# Patient Record
Sex: Male | Born: 1980 | Race: White | Hispanic: No | Marital: Single | Smoking: Never smoker
Health system: Southern US, Community
[De-identification: ages and names within clinical notes are randomized; demographics above are authoritative.]

## PROBLEM LIST (undated history)

## (undated) ENCOUNTER — Emergency Department (HOSPITAL_COMMUNITY): Admission: EM | Payer: BC Managed Care – PPO | Source: Home / Self Care

## (undated) DIAGNOSIS — R51 Headache: Secondary | ICD-10-CM

## (undated) DIAGNOSIS — K219 Gastro-esophageal reflux disease without esophagitis: Secondary | ICD-10-CM

## (undated) HISTORY — DX: Gastro-esophageal reflux disease without esophagitis: K21.9

## (undated) HISTORY — PX: HERNIA REPAIR: SHX51

---

## 2013-03-01 ENCOUNTER — Other Ambulatory Visit: Payer: Self-pay | Admitting: Gastroenterology

## 2013-03-01 DIAGNOSIS — R1013 Epigastric pain: Secondary | ICD-10-CM

## 2013-03-08 ENCOUNTER — Ambulatory Visit
Admission: RE | Admit: 2013-03-08 | Discharge: 2013-03-08 | Disposition: A | Discharge status: Home / Self Care | Payer: BC Managed Care – PPO | Source: Ambulatory Visit | Attending: Gastroenterology | Admitting: Gastroenterology

## 2013-03-08 DIAGNOSIS — R1013 Epigastric pain: Secondary | ICD-10-CM

## 2013-03-11 ENCOUNTER — Other Ambulatory Visit: Payer: Self-pay | Admitting: Gastroenterology

## 2013-03-11 DIAGNOSIS — R1013 Epigastric pain: Secondary | ICD-10-CM

## 2013-03-14 ENCOUNTER — Other Ambulatory Visit: Payer: Self-pay | Admitting: Gastroenterology

## 2013-03-14 DIAGNOSIS — R1013 Epigastric pain: Secondary | ICD-10-CM

## 2013-03-14 DIAGNOSIS — N200 Calculus of kidney: Secondary | ICD-10-CM

## 2013-03-19 ENCOUNTER — Other Ambulatory Visit: Payer: Self-pay | Admitting: Gastroenterology

## 2013-03-19 DIAGNOSIS — R1013 Epigastric pain: Secondary | ICD-10-CM

## 2013-03-19 DIAGNOSIS — R935 Abnormal findings on diagnostic imaging of other abdominal regions, including retroperitoneum: Secondary | ICD-10-CM

## 2013-03-20 ENCOUNTER — Ambulatory Visit
Admission: RE | Admit: 2013-03-20 | Discharge: 2013-03-20 | Disposition: A | Discharge status: Home / Self Care | Payer: BC Managed Care – PPO | Source: Ambulatory Visit | Attending: Gastroenterology | Admitting: Gastroenterology

## 2013-03-20 DIAGNOSIS — R935 Abnormal findings on diagnostic imaging of other abdominal regions, including retroperitoneum: Secondary | ICD-10-CM

## 2013-03-20 DIAGNOSIS — R1013 Epigastric pain: Secondary | ICD-10-CM

## 2013-03-20 MED ORDER — IOHEXOL 300 MG/ML  SOLN
100.0000 mL | Freq: Once | INTRAMUSCULAR | Status: AC | PRN
  Administered 2013-03-20: 100 mL via INTRAVENOUS

## 2013-03-22 ENCOUNTER — Other Ambulatory Visit: Payer: Self-pay | Admitting: Gastroenterology

## 2013-03-22 DIAGNOSIS — R1013 Epigastric pain: Secondary | ICD-10-CM

## 2013-03-29 ENCOUNTER — Ambulatory Visit (HOSPITAL_COMMUNITY)
Admission: RE | Admit: 2013-03-29 | Discharge: 2013-03-29 | Disposition: A | Discharge status: Home / Self Care | Payer: BC Managed Care – PPO | Source: Ambulatory Visit | Attending: Gastroenterology | Admitting: Gastroenterology

## 2013-03-29 DIAGNOSIS — R948 Abnormal results of function studies of other organs and systems: Secondary | ICD-10-CM | POA: Insufficient documentation

## 2013-03-29 DIAGNOSIS — R1013 Epigastric pain: Secondary | ICD-10-CM | POA: Insufficient documentation

## 2013-03-29 MED ORDER — SINCALIDE 5 MCG IJ SOLR
INTRAMUSCULAR | Status: AC
Start: 2013-03-29 — End: 2013-03-29
  Administered 2013-03-29: 1.95 ug via INTRAVENOUS
  Filled 2013-03-29: qty 5

## 2013-03-29 MED ORDER — SINCALIDE 5 MCG IJ SOLR
0.0200 ug/kg | Freq: Once | INTRAMUSCULAR | Status: AC
  Administered 2013-03-29: 1.95 ug via INTRAVENOUS

## 2013-04-01 ENCOUNTER — Ambulatory Visit (INDEPENDENT_AMBULATORY_CARE_PROVIDER_SITE_OTHER): Payer: BC Managed Care – PPO | Admitting: General Surgery

## 2013-04-01 ENCOUNTER — Encounter (INDEPENDENT_AMBULATORY_CARE_PROVIDER_SITE_OTHER): Payer: Self-pay | Admitting: General Surgery

## 2013-04-01 VITALS — BP 120/80 | HR 72 | Temp 98.5°F | Resp 14 | Ht 72.0 in | Wt 212.4 lb

## 2013-04-01 DIAGNOSIS — K828 Other specified diseases of gallbladder: Secondary | ICD-10-CM

## 2013-04-01 NOTE — Progress Notes (Signed)
Patient ID: Grant [name], male   DOB: 06/12/1980, 32 y.o.   MRN: [id number]  Chief Complaint  Patient presents with  . New Evaluation    eval GB    HPI Grant [name] is a 32 y.o. male.  The patient comes in for evaluation for biliary dyskinesia.  HPI The patient has had symptoms for these the past 5 months. The pain that he has had this pain in the epigastrium and right upper quadrant. It is not always postprandial, however he does occur sometimes after eating large amounts of fatty foods. A workup has ensued with him seeing a gastroenterologist locally. The only positive study was the HIDA scan. This demonstrated an ejection fraction of only 12%. According to the patient, he was also symptomatic with nausea and pain when he was injected for the study.  Past Medical History  Diagnosis Date  . GERD (gastroesophageal reflux disease)     Past Surgical History  Procedure Laterality Date  . Hernia repair      History reviewed. No pertinent family history.  Social History History  Substance Use Topics  . Smoking status: Never Smoker   . Smokeless tobacco: Never Used  . Alcohol Use: 1.0 oz/week    2 drink(s) per week     Comment: weekly    Allergies  Allergen Reactions  . Oxycodone   . Penicillins     No current outpatient prescriptions on file.   No current facility-administered medications for this visit.    Review of Systems Review of Systems  Constitutional: Positive for appetite change. Negative for fever and chills.  Gastrointestinal: Positive for abdominal pain.  All other systems reviewed and are negative.    Blood pressure 120/80, pulse 72, temperature 98.5 F (36.9 C), temperature source Oral, resp. rate 14, height 6' (1.829 m), weight 212 lb 6.6 oz (96.349 kg).  Physical Exam Physical Exam  Vitals reviewed. Constitutional: He is oriented to person, place, and time. He appears well-developed and well-nourished.  HENT:  Head:  Normocephalic and atraumatic.  Eyes: Conjunctivae and EOM are normal. Pupils are equal, round, and reactive to light.  Neck: Normal range of motion. Neck supple.  Cardiovascular: Normal rate, regular rhythm and normal heart sounds.   Pulmonary/Chest: Effort normal and breath sounds normal.  Abdominal: Soft. Bowel sounds are normal. There is tenderness (epigastrium and RUQ).  Musculoskeletal: Normal range of motion.  Neurological: He is alert and oriented to person, place, and time. He has normal reflexes.  Skin: Skin is warm and dry.  Psychiatric: He has a normal mood and affect. His behavior is normal. Thought content normal.    Data Reviewed I have reviewed all the studies done to workup his abdominal pain. I've also reviewed the notes from the referring physician.  Assessment    Symptomatic biliary dyskinesia.     Plan    Laparoscopic cholecystectomy with possible cholangiogram.  The risk and benefits have been explained to the patient and his spouse. They wish to proceed as soon as possible.     The patient also has a left lower lobe nodule seen on his CT scan done for his abdominal discomfort. This may warrant further evaluation in the future.   Magdeline Prange O 04/01/2013, 1:04 PM    

## 2013-04-09 ENCOUNTER — Encounter (HOSPITAL_COMMUNITY): Payer: Self-pay | Admitting: Pharmacist

## 2013-04-10 ENCOUNTER — Encounter (HOSPITAL_COMMUNITY)
Admission: RE | Admit: 2013-04-10 | Discharge: 2013-04-10 | Disposition: A | Discharge status: Home / Self Care | Payer: BC Managed Care – PPO | Source: Ambulatory Visit | Attending: General Surgery | Admitting: General Surgery

## 2013-04-10 ENCOUNTER — Encounter (HOSPITAL_COMMUNITY): Payer: Self-pay

## 2013-04-10 DIAGNOSIS — Z01812 Encounter for preprocedural laboratory examination: Secondary | ICD-10-CM | POA: Insufficient documentation

## 2013-04-10 HISTORY — DX: Headache: R51

## 2013-04-10 LAB — COMPREHENSIVE METABOLIC PANEL
ALBUMIN: 4.1 g/dL (ref 3.5–5.2)
ALT: 15 U/L (ref 0–53)
AST: 23 U/L (ref 0–37)
Alkaline Phosphatase: 56 U/L (ref 39–117)
BUN: 11 mg/dL (ref 6–23)
CHLORIDE: 101 meq/L (ref 96–112)
CO2: 30 meq/L (ref 19–32)
Calcium: 9.6 mg/dL (ref 8.4–10.5)
Creatinine, Ser: 1.05 mg/dL (ref 0.50–1.35)
GFR calc Af Amer: >90 mL/min (ref >90)
Glucose, Bld: 82 mg/dL (ref 70–99)
Potassium: 4.3 mEq/L (ref 3.7–5.3)
SODIUM: 141 meq/L (ref 137–147)
Total Bilirubin: 0.5 mg/dL (ref 0.3–1.2)
Total Protein: 7.5 g/dL (ref 6.0–8.3)

## 2013-04-10 LAB — CBC WITH DIFFERENTIAL/PLATELET
BASOS PCT: 1 % (ref 0–1)
Basophils Absolute: 0.1 10*3/uL (ref 0.0–0.1)
Eosinophils Absolute: 0.3 10*3/uL (ref 0.0–0.7)
Eosinophils Relative: 6 % — ABNORMAL HIGH (ref 0–5)
HCT: 46 % (ref 39.0–52.0)
Hemoglobin: 16.2 g/dL (ref 13.0–17.0)
LYMPHS PCT: 37 % (ref 12–46)
Lymphs Abs: 1.9 10*3/uL (ref 0.7–4.0)
MCH: 30.4 pg (ref 26.0–34.0)
MCHC: 35.2 g/dL (ref 30.0–36.0)
MCV: 86.3 fL (ref 78.0–100.0)
Monocytes Absolute: 0.3 10*3/uL (ref 0.1–1.0)
Monocytes Relative: 7 % (ref 3–12)
NEUTROS ABS: 2.5 10*3/uL (ref 1.7–7.7)
Neutrophils Relative %: 49 % (ref 43–77)
PLATELETS: 261 10*3/uL (ref 150–400)
RBC: 5.33 MIL/uL (ref 4.22–5.81)
RDW: 12.1 % (ref 11.5–15.5)
WBC: 5.1 10*3/uL (ref 4.0–10.5)

## 2013-04-14 MED ORDER — CIPROFLOXACIN IN D5W 400 MG/200ML IV SOLN
400.0000 mg | INTRAVENOUS | Status: AC
  Administered 2013-04-15: 400 mg via INTRAVENOUS
  Filled 2013-04-14: qty 200

## 2013-04-15 ENCOUNTER — Encounter (HOSPITAL_COMMUNITY): Payer: BC Managed Care – PPO | Admitting: Anesthesiology

## 2013-04-15 ENCOUNTER — Ambulatory Visit (HOSPITAL_COMMUNITY): Payer: BC Managed Care – PPO | Admitting: Anesthesiology

## 2013-04-15 ENCOUNTER — Encounter (HOSPITAL_COMMUNITY)
Admission: RE | Disposition: A | Discharge status: Home / Self Care | Payer: Self-pay | Source: Ambulatory Visit | Attending: General Surgery

## 2013-04-15 ENCOUNTER — Ambulatory Visit (HOSPITAL_COMMUNITY)
Admission: RE | Admit: 2013-04-15 | Discharge: 2013-04-15 | Disposition: A | Discharge status: Home / Self Care | Payer: BC Managed Care – PPO | Source: Ambulatory Visit | Attending: General Surgery | Admitting: General Surgery

## 2013-04-15 ENCOUNTER — Ambulatory Visit (HOSPITAL_COMMUNITY): Payer: BC Managed Care – PPO

## 2013-04-15 ENCOUNTER — Encounter (HOSPITAL_COMMUNITY): Payer: Self-pay | Admitting: Anesthesiology

## 2013-04-15 DIAGNOSIS — K828 Other specified diseases of gallbladder: Secondary | ICD-10-CM

## 2013-04-15 DIAGNOSIS — K811 Chronic cholecystitis: Secondary | ICD-10-CM

## 2013-04-15 DIAGNOSIS — R51 Headache: Secondary | ICD-10-CM | POA: Insufficient documentation

## 2013-04-15 DIAGNOSIS — K219 Gastro-esophageal reflux disease without esophagitis: Secondary | ICD-10-CM | POA: Insufficient documentation

## 2013-04-15 HISTORY — PX: CHOLECYSTECTOMY: SHX55

## 2013-04-15 SURGERY — LAPAROSCOPIC CHOLECYSTECTOMY WITH INTRAOPERATIVE CHOLANGIOGRAM
Anesthesia: General | Site: Abdomen

## 2013-04-15 MED ORDER — ONDANSETRON HCL 4 MG/2ML IJ SOLN
INTRAMUSCULAR | Status: DC | PRN
  Administered 2013-04-15: 4 mg via INTRAVENOUS

## 2013-04-15 MED ORDER — ONDANSETRON HCL 4 MG/2ML IJ SOLN
INTRAMUSCULAR | Status: AC
  Filled 2013-04-15: qty 2

## 2013-04-15 MED ORDER — FENTANYL CITRATE 0.05 MG/ML IJ SOLN
25.0000 ug | INTRAMUSCULAR | Status: DC | PRN
  Administered 2013-04-15: 50 ug via INTRAVENOUS

## 2013-04-15 MED ORDER — PHENYLEPHRINE 40 MCG/ML (10ML) SYRINGE FOR IV PUSH (FOR BLOOD PRESSURE SUPPORT)
PREFILLED_SYRINGE | INTRAVENOUS | Status: AC
  Filled 2013-04-15: qty 10

## 2013-04-15 MED ORDER — GLYCOPYRROLATE 0.2 MG/ML IJ SOLN
INTRAMUSCULAR | Status: AC
  Filled 2013-04-15: qty 3

## 2013-04-15 MED ORDER — ARTIFICIAL TEARS OP OINT
TOPICAL_OINTMENT | OPHTHALMIC | Status: AC
  Filled 2013-04-15: qty 3.5

## 2013-04-15 MED ORDER — PROPOFOL 10 MG/ML IV BOLUS
INTRAVENOUS | Status: DC | PRN
  Administered 2013-04-15: 200 mg via INTRAVENOUS

## 2013-04-15 MED ORDER — NEOSTIGMINE METHYLSULFATE 1 MG/ML IJ SOLN
INTRAMUSCULAR | Status: AC
  Filled 2013-04-15: qty 10

## 2013-04-15 MED ORDER — FENTANYL CITRATE 0.05 MG/ML IJ SOLN
INTRAMUSCULAR | Status: DC | PRN
  Administered 2013-04-15: 50 ug via INTRAVENOUS
  Administered 2013-04-15: 150 ug via INTRAVENOUS
  Administered 2013-04-15: 50 ug via INTRAVENOUS

## 2013-04-15 MED ORDER — TRAMADOL HCL 50 MG PO TABS: 50.0000 mg | ORAL_TABLET | Freq: Four times a day (QID) | ORAL | Status: DC | PRN

## 2013-04-15 MED ORDER — ROCURONIUM BROMIDE 100 MG/10ML IV SOLN
INTRAVENOUS | Status: DC | PRN
  Administered 2013-04-15: 45 mg via INTRAVENOUS

## 2013-04-15 MED ORDER — ROCURONIUM BROMIDE 50 MG/5ML IV SOLN
INTRAVENOUS | Status: AC
  Filled 2013-04-15: qty 1

## 2013-04-15 MED ORDER — KETOROLAC TROMETHAMINE 30 MG/ML IJ SOLN
INTRAMUSCULAR | Status: AC
  Filled 2013-04-15: qty 1

## 2013-04-15 MED ORDER — MIDAZOLAM HCL 2 MG/2ML IJ SOLN
INTRAMUSCULAR | Status: AC
  Filled 2013-04-15: qty 2

## 2013-04-15 MED ORDER — GLYCOPYRROLATE 0.2 MG/ML IJ SOLN
INTRAMUSCULAR | Status: AC
Start: 2013-04-15 — End: 2013-04-15
  Filled 2013-04-15: qty 4

## 2013-04-15 MED ORDER — LACTATED RINGERS IV SOLN
INTRAVENOUS | Status: DC | PRN
  Administered 2013-04-15 (×2): via INTRAVENOUS

## 2013-04-15 MED ORDER — PROPOFOL 10 MG/ML IV BOLUS
INTRAVENOUS | Status: AC
  Filled 2013-04-15: qty 20

## 2013-04-15 MED ORDER — 0.9 % SODIUM CHLORIDE (POUR BTL) OPTIME
TOPICAL | Status: DC | PRN
  Administered 2013-04-15: 1000 mL

## 2013-04-15 MED ORDER — ACETAMINOPHEN 325 MG PO TABS: 325.0000 mg | ORAL_TABLET | ORAL | Status: DC | PRN

## 2013-04-15 MED ORDER — ONDANSETRON HCL 4 MG/2ML IJ SOLN: 4.0000 mg | Freq: Once | INTRAMUSCULAR | Status: DC | PRN

## 2013-04-15 MED ORDER — KETOROLAC TROMETHAMINE 30 MG/ML IJ SOLN
15.0000 mg | Freq: Once | INTRAMUSCULAR | Status: AC | PRN
  Administered 2013-04-15: 15 mg via INTRAVENOUS

## 2013-04-15 MED ORDER — ARTIFICIAL TEARS OP OINT
TOPICAL_OINTMENT | OPHTHALMIC | Status: DC | PRN
  Administered 2013-04-15: 1 via OPHTHALMIC

## 2013-04-15 MED ORDER — GLYCOPYRROLATE 0.2 MG/ML IJ SOLN
INTRAMUSCULAR | Status: DC | PRN
  Administered 2013-04-15: 1 mg via INTRAVENOUS

## 2013-04-15 MED ORDER — ACETAMINOPHEN 160 MG/5ML PO SOLN
325.0000 mg | ORAL | Status: DC | PRN
  Filled 2013-04-15: qty 20.3

## 2013-04-15 MED ORDER — BUPIVACAINE-EPINEPHRINE 0.25% -1:200000 IJ SOLN
INTRAMUSCULAR | Status: DC | PRN
  Administered 2013-04-15: 30 mL

## 2013-04-15 MED ORDER — NEOSTIGMINE METHYLSULFATE 1 MG/ML IJ SOLN
INTRAMUSCULAR | Status: DC | PRN
  Administered 2013-04-15: 5 mg via INTRAVENOUS

## 2013-04-15 MED ORDER — LACTATED RINGERS IV SOLN
INTRAVENOUS | Status: DC
  Administered 2013-04-15: 08:00:00 via INTRAVENOUS

## 2013-04-15 MED ORDER — FENTANYL CITRATE 0.05 MG/ML IJ SOLN
INTRAMUSCULAR | Status: AC
  Filled 2013-04-15: qty 2

## 2013-04-15 MED ORDER — SODIUM CHLORIDE 0.9 % IR SOLN
Status: DC | PRN
  Administered 2013-04-15: 1000 mL

## 2013-04-15 MED ORDER — FENTANYL CITRATE 0.05 MG/ML IJ SOLN
INTRAMUSCULAR | Status: AC
  Filled 2013-04-15: qty 5

## 2013-04-15 MED ORDER — LIDOCAINE HCL (CARDIAC) 20 MG/ML IV SOLN
INTRAVENOUS | Status: DC | PRN
  Administered 2013-04-15: 80 mg via INTRAVENOUS

## 2013-04-15 MED ORDER — SODIUM CHLORIDE 0.9 % IV SOLN
INTRAVENOUS | Status: DC | PRN
  Administered 2013-04-15: 10:00:00

## 2013-04-15 MED ORDER — BUPIVACAINE-EPINEPHRINE PF 0.25-1:200000 % IJ SOLN
INTRAMUSCULAR | Status: AC
  Filled 2013-04-15: qty 30

## 2013-04-15 MED ORDER — CHLORHEXIDINE GLUCONATE 4 % EX LIQD
1.0000 "application " | Freq: Once | CUTANEOUS | Status: DC
  Filled 2013-04-15: qty 15

## 2013-04-15 SURGICAL SUPPLY — 46 items
APPLIER CLIP 5 13 M/L LIGAMAX5 (MISCELLANEOUS) ×3
APPLIER CLIP ROT 10 11.4 M/L (STAPLE)
BLADE SURG ROTATE 9660 (MISCELLANEOUS) ×3 IMPLANT
CANISTER SUCTION 2500CC (MISCELLANEOUS) ×3 IMPLANT
CHLORAPREP W/TINT 26ML (MISCELLANEOUS) ×3 IMPLANT
CLIP APPLIE 5 13 M/L LIGAMAX5 (MISCELLANEOUS) ×1 IMPLANT
CLIP APPLIE ROT 10 11.4 M/L (STAPLE) IMPLANT
CLOSURE WOUND 1/2 X4 (GAUZE/BANDAGES/DRESSINGS) ×1
COVER MAYO STAND STRL (DRAPES) ×3 IMPLANT
COVER SURGICAL LIGHT HANDLE (MISCELLANEOUS) ×3 IMPLANT
DERMABOND ADVANCED (GAUZE/BANDAGES/DRESSINGS) ×2
DERMABOND ADVANCED .7 DNX12 (GAUZE/BANDAGES/DRESSINGS) ×1 IMPLANT
DRAPE C-ARM 42X72 X-RAY (DRAPES) ×3 IMPLANT
DRAPE UTILITY 15X26 W/TAPE STR (DRAPE) ×6 IMPLANT
DRSG TEGADERM 2-3/8X2-3/4 SM (GAUZE/BANDAGES/DRESSINGS) ×3 IMPLANT
ELECT REM PT RETURN 9FT ADLT (ELECTROSURGICAL) ×3
ELECTRODE REM PT RTRN 9FT ADLT (ELECTROSURGICAL) ×1 IMPLANT
GLOVE BIO SURGEON STRL SZ7.5 (GLOVE) ×3 IMPLANT
GLOVE BIO SURGEON STRL SZ8 (GLOVE) ×3 IMPLANT
GLOVE BIOGEL PI IND STRL 7.0 (GLOVE) ×2 IMPLANT
GLOVE BIOGEL PI IND STRL 7.5 (GLOVE) ×1 IMPLANT
GLOVE BIOGEL PI IND STRL 8 (GLOVE) ×2 IMPLANT
GLOVE BIOGEL PI INDICATOR 7.0 (GLOVE) ×4
GLOVE BIOGEL PI INDICATOR 7.5 (GLOVE) ×2
GLOVE BIOGEL PI INDICATOR 8 (GLOVE) ×4
GLOVE ECLIPSE 7.5 STRL STRAW (GLOVE) ×6 IMPLANT
GLOVE ECLIPSE 8.0 STRL XLNG CF (GLOVE) ×3 IMPLANT
GOWN STRL NON-REIN LRG LVL3 (GOWN DISPOSABLE) ×9 IMPLANT
KIT BASIN OR (CUSTOM PROCEDURE TRAY) ×3 IMPLANT
KIT ROOM TURNOVER OR (KITS) ×3 IMPLANT
NS IRRIG 1000ML POUR BTL (IV SOLUTION) ×3 IMPLANT
PAD ARMBOARD 7.5X6 YLW CONV (MISCELLANEOUS) ×3 IMPLANT
POUCH SPECIMEN RETRIEVAL 10MM (ENDOMECHANICALS) ×3 IMPLANT
SCISSORS LAP 5X35 DISP (ENDOMECHANICALS) ×3 IMPLANT
SET CHOLANGIOGRAPH 5 50 .035 (SET/KITS/TRAYS/PACK) ×3 IMPLANT
SET IRRIG TUBING LAPAROSCOPIC (IRRIGATION / IRRIGATOR) ×3 IMPLANT
SLEEVE ENDOPATH XCEL 5M (ENDOMECHANICALS) ×3 IMPLANT
SPECIMEN JAR SMALL (MISCELLANEOUS) ×3 IMPLANT
STRIP CLOSURE SKIN 1/2X4 (GAUZE/BANDAGES/DRESSINGS) ×2 IMPLANT
SUT MNCRL AB 4-0 PS2 18 (SUTURE) ×3 IMPLANT
TOWEL OR 17X24 6PK STRL BLUE (TOWEL DISPOSABLE) ×3 IMPLANT
TOWEL OR 17X26 10 PK STRL BLUE (TOWEL DISPOSABLE) ×3 IMPLANT
TRAY LAPAROSCOPIC (CUSTOM PROCEDURE TRAY) ×3 IMPLANT
TROCAR XCEL BLUNT TIP 100MML (ENDOMECHANICALS) ×3 IMPLANT
TROCAR XCEL NON-BLD 11X100MML (ENDOMECHANICALS) IMPLANT
TROCAR XCEL NON-BLD 5MMX100MML (ENDOMECHANICALS) ×3 IMPLANT

## 2013-04-15 NOTE — Op Note (Signed)
OPERATIVE REPORT  DATE OF OPERATION: 04/15/2013  PATIENT:  Grant [name]  33 y.o. male  PRE-OPERATIVE DIAGNOSIS:  Symptomatic biliary dyskinesia  POST-OPERATIVE DIAGNOSIS:  Symptomatic biliary dyskinesia  PROCEDURE:  Procedure(s): LAPAROSCOPIC CHOLECYSTECTOMY WITH INTRAOPERATIVE CHOLANGIOGRAM  SURGEON:  Surgeon(s): Cherylynn RidgesJames O Estevon Fluke, MD  ASSISTANOrson Slick: Bowman, RNFA  ANESTHESIA:   general  EBL: <20 ml  BLOOD ADMINISTERED: none  DRAINS: none   SPECIMEN:  Source of Specimen:  Gallbladder  COUNTS CORRECT:  YES  PROCEDURE DETAILS: The patient was taken to the operating room and placed on the table in the supine position.  After an adequate endotracheal anesthetic was administered, he was prepped with ChloroPrep, and then draped in the usual manner exposing the entire abdomen laterally, inferiorly and up  to the costal margins.  After a proper timeout was performed including identifying the patient and the procedure to be performed, a supra-umbilical 1.5cm midline incision was made using a #15 blade.  This was taken down to the fascia which was then incised with a #15 blade.  The edges of the fascia were tented up with Kocher clamps as the preperitoneal space was penetrated with a Kelly clamp into the peritoneum.  Once this was done, a pursestring suture of 0 Vicryl was passed around the fascial opening.  This was subsequently used to secure the [facility] which was passed into the peritoneal cavity.  Once the [facility] cannula was in place, carbon dioxide gas was insufflated into the peritoneal cavity up to a maximal intra-abdominal pressure of 15mm Hg.The laparoscope, with attached camera and light source, was passed into the peritoneal cavity to visualize the direct insertion of two right upper quadrant 5mm cannulas, and a sup-xiphoid 11mm cannula.  Once all cannulas were in place, the dissection was begun.  The patient's liver was not friable however it demonstrated some white what  appeared to be scarred areas throughout the parenchyma of the liver. No biopsies were taken. The picture is included on the chart.  Two ratcheted graspers were attached to the dome and infundibulum of the gallbladder and retracted towards the anterior abdominal wall and the right upper quadrant.  Using cautery attached to a dissecting forceps, the peritoneum overlaying the triangle of Chalot and the hepatoduodenal triangle was dissected away exposing the cystic duct and the cystic artery.  A clip was placed on the gallbladder side of the cystic duct, then a cholecytodochotomy made using the laparoscopic scissors.  Through the cholecystodochotomy a Cook catheter was passed to performed a cholangiogram.  The cholangiogram showed Good flow into the duodenum, the proximal filling, no dilatation, and no intraductal filling defects..  Once the cholangiogram was completed, the [facility] catheter was removed, and the distal cystic duct was clipped multiple times then transected.  The gallbladder was then dissected out of the hepatic bed without event.  It was retrieved from the abdomen without event.  Once the gallbladder was removed, the bed was inspected for hemostasis.  Once excellent hemostasis was obtained all gas and fluids were aspirated from above the liver, then the cannulas were removed.  The supra-umbilical incision was closed using the pursestring suture which was in place.  0.25% bupivicaine with epinephrine was injected at all sites.  All 10mm or greater cannula sites were close using a running subcuticular stitch of 4-0 Monocryl.  5.270mm cannula sites were closed with Dermabond only.Steri-Strips and Tagaderm were used to complete the dressings at all sites.  At this point all needle, sponge, and instrument counts were correct.The  patient was awakened from anesthesia and taken to the PACU in stable condition.  PATIENT DISPOSITION:  PACU - hemodynamically stable.   Cherylynn Ridges 3/2/201510:29  AM

## 2013-04-15 NOTE — Transfer of Care (Signed)
Immediate Anesthesia Transfer of Care Note  Patient: Grant [name]  Procedure(s) Performed: Procedure(s): LAPAROSCOPIC CHOLECYSTECTOMY WITH INTRAOPERATIVE CHOLANGIOGRAM (N/A)  Patient Location: PACU  Anesthesia Type:General  Level of Consciousness: awake, alert  and oriented  Airway & Oxygen Therapy: Patient Spontanous Breathing and Patient connected to nasal cannula oxygen  Post-op Assessment: Report given to PACU RN, Post -op Vital signs reviewed and stable and Patient moving all extremities X 4  Post vital signs: Reviewed and stable  Complications: No apparent anesthesia complications

## 2013-04-15 NOTE — Anesthesia Procedure Notes (Signed)
Procedure Name: Intubation Date/Time: 04/15/2013 9:26 AM Performed by: Carmela RimaMARTINELLI, Elizet Kaplan F Pre-anesthesia Checklist: Patient identified, Timeout performed, Emergency Drugs available, Suction available and Patient being monitored Patient Re-evaluated:Patient Re-evaluated prior to inductionOxygen Delivery Method: Circle system utilized Preoxygenation: Pre-oxygenation with 100% oxygen Intubation Type: IV induction Ventilation: Mask ventilation without difficulty Laryngoscope Size: Mac and 4 Grade View: Grade II Tube type: Oral Tube size: 7.5 mm Number of attempts: 1 Placement Confirmation: positive ETCO2,  ETT inserted through vocal cords under direct vision and breath sounds checked- equal and bilateral Secured at: 24 cm Tube secured with: Tape Dental Injury: Teeth and Oropharynx as per pre-operative assessment

## 2013-04-15 NOTE — Interval H&P Note (Signed)
History and Physical Interval Note:  The patient is doing fine, has had a few episodes of nausea and discomfort.  No other changes.  Answered questions concerning lifting, return to work, and other activities.  04/15/2013 8:43 AM  Grant [name]  has presented today for surgery, with the diagnosis of Symptomatic biliary dyskinesia  The various methods of treatment have been discussed with the patient and family. After consideration of risks, benefits and other options for treatment, the patient has consented to  Procedure(s): LAPAROSCOPIC CHOLECYSTECTOMY WITH INTRAOPERATIVE CHOLANGIOGRAM (N/A) as a surgical intervention .  The patient's history has been reviewed, patient examined, no change in status, stable for surgery.  I have reviewed the patient's chart and labs.  Questions were answered to the patient's satisfaction.     Kimiko Common, Marta LamasJAMES O

## 2013-04-15 NOTE — Discharge Instructions (Addendum)
Laparoscopic Cholecystectomy, Care After Refer to this sheet in the next few weeks. These instructions provide you with information on caring for yourself after your procedure. Your health care provider may also give you more specific instructions. Your treatment has been planned according to current medical practices, but problems sometimes occur. Call your health care provider if you have any problems or questions after your procedure. WHAT TO EXPECT AFTER THE PROCEDURE After your procedure, it is typical to have the following:  Pain at your incision sites. You will be given pain medicines to control the pain.  Mild nausea or vomiting. This should improve after the first 24 hours.  Bloating and possibly shoulder pain from the gas used during the procedure. This will improve after the first 24 hours. HOME CARE INSTRUCTIONS   Change bandages (dressings) as directed by your health care provider.  Your wounds are covered with plastic dressings and you can shower immediately.  Keep the wound dry and clean. You may wash the wound gently with soap and water. Gently blot or dab the area dry.  Do not take baths or use swimming pools or hot tubs for 2 weeks or until your health care provider approves.  Only take over-the-counter or prescription medicines as directed by your health care provider.  Continue your normal diet as directed by your health care provider.  Do not lift anything heavier than 20 pounds (4.5 kg) until your health care provider approves--for the next 3 weeks..  Do not play contact sports for 1 week or until your health care provider approves. SEEK MEDICAL CARE IF:   You have redness, swelling, or increasing pain in the wound.  You notice yellowish-white fluid (pus) coming from the wound.  You have drainage from the wound that lasts longer than 1 day.  You notice a bad smell coming from the wound or dressing.  Your surgical cuts (incisions) break open. SEEK IMMEDIATE  MEDICAL CARE IF:   You develop a rash.  You have difficulty breathing.  You have chest pain.  You have a fever.  You have increasing pain in the shoulders (shoulder strap areas).  You have dizzy episodes or faint while standing.  You have severe abdominal pain.  You feel sick to your stomach (nauseous) or throw up (vomit) and this lasts for more than 1 day. Document Released: 01/31/2005 Document Revised: 11/21/2012 Document Reviewed: 09/12/2012 [facility] Patient Information 2014 [employer] [place].

## 2013-04-15 NOTE — H&P (View-Only) (Signed)
Patient ID: Grant Grant [name], male   DOB: 07/08/1980, 33 y.Grant [name].   MRN: [id number]  Chief Complaint  Patient presents with  . New Evaluation    eval GB    HPI Grant Grant [name] is a 33 y.Grant [name]. male.  The patient comes in for evaluation for biliary dyskinesia.  HPI The patient has had symptoms for these the past 5 months. The pain that he has had this pain in the epigastrium and right upper quadrant. It is not always postprandial, however he does occur sometimes after eating large amounts of fatty foods. A workup has ensued with him seeing a gastroenterologist locally. The only positive study was the HIDA scan. This demonstrated an ejection fraction of only 12%. According to the patient, he was also symptomatic with nausea and pain when he was injected for the study.  Past Medical History  Diagnosis Date  . GERD (gastroesophageal reflux disease)     Past Surgical History  Procedure Laterality Date  . Hernia repair      History reviewed. No pertinent family history.  Social History History  Substance Use Topics  . Smoking status: Never Smoker   . Smokeless tobacco: Never Used  . Alcohol Use: 1.0 oz/week    2 drink(s) per week     Comment: weekly    Allergies  Allergen Reactions  . Oxycodone   . Penicillins     No current outpatient prescriptions on file.   No current facility-administered medications for this visit.    Review of Systems Review of Systems  Constitutional: Positive for appetite change. Negative for fever and chills.  Gastrointestinal: Positive for abdominal pain.  All other systems reviewed and are negative.    Blood pressure 120/80, pulse 72, temperature 98.5 F (36.9 C), temperature source Oral, resp. rate 14, height 6' (1.829 m), weight 212 lb 6.6 oz (96.349 kg).  Physical Exam Physical Exam  Vitals reviewed. Constitutional: He is oriented to person, place, and time. He appears well-developed and well-nourished.  HENT:  Head:  Normocephalic and atraumatic.  Eyes: Conjunctivae and EOM are normal. Pupils are equal, round, and reactive to light.  Neck: Normal range of motion. Neck supple.  Cardiovascular: Normal rate, regular rhythm and normal heart sounds.   Pulmonary/Chest: Effort normal and breath sounds normal.  Abdominal: Soft. Bowel sounds are normal. There is tenderness (epigastrium and RUQ).  Musculoskeletal: Normal range of motion.  Neurological: He is alert and oriented to person, place, and time. He has normal reflexes.  Skin: Skin is warm and dry.  Psychiatric: He has a normal mood and affect. His behavior is normal. Thought content normal.    Data Reviewed I have reviewed all the studies done to workup his abdominal pain. I've also reviewed the notes from the referring physician.  Assessment    Symptomatic biliary dyskinesia.     Plan    Laparoscopic cholecystectomy with possible cholangiogram.  The risk and benefits have been explained to the patient and his spouse. They wish to proceed as soon as possible.     The patient also has a left lower lobe nodule seen on his CT scan done for his abdominal discomfort. This may warrant further evaluation in the future.   Cherylynn RidgesWYATT, Grant Grant [name] 04/01/2013, 1:04 PM

## 2013-04-15 NOTE — Anesthesia Preprocedure Evaluation (Addendum)
Anesthesia Evaluation  Patient identified by MRN, date of birth, ID band Patient awake    Reviewed: Allergy & Precautions, H&P , NPO status , Patient's Chart, lab work & pertinent test results  History of Anesthesia Complications Negative for: history of anesthetic complications  Airway Mallampati: I TM Distance: >3 FB Neck ROM: Full    Dental  (+) Teeth Intact, Dental Advidsory Given   Pulmonary neg pulmonary ROS,  breath sounds clear to auscultation        Cardiovascular negative cardio ROS  Rhythm:Regular Rate:Normal     Neuro/Psych  Headaches, negative neurological ROS  negative psych ROS   GI/Hepatic Neg liver ROS, GERD-  Controlled,  Endo/Other  negative endocrine ROS  Renal/GU negative Renal ROS     Musculoskeletal negative musculoskeletal ROS (+)   Abdominal   Peds  Hematology   Anesthesia Other Findings   Reproductive/Obstetrics                          Anesthesia Physical Anesthesia Plan  ASA: I  Anesthesia Plan: General   Post-op Pain Management:    Induction: Intravenous  Airway Management Planned: Oral ETT  Additional Equipment: None  Intra-op Plan:   Post-operative Plan: Extubation in OR  Informed Consent: I have reviewed the patients History and Physical, chart, labs and discussed the procedure including the risks, benefits and alternatives for the proposed anesthesia with the patient or authorized representative who has indicated his/her understanding and acceptance.   Dental advisory given and Dental Advisory Given  Plan Discussed with: CRNA, Surgeon and Anesthesiologist  Anesthesia Plan Comments:        Anesthesia Quick Evaluation

## 2013-04-15 NOTE — Anesthesia Postprocedure Evaluation (Signed)
  Anesthesia Post-op Note  Patient: Grant [name]  Procedure(s) Performed: Procedure(s): LAPAROSCOPIC CHOLECYSTECTOMY WITH INTRAOPERATIVE CHOLANGIOGRAM (N/A)  Patient Location: PACU  Anesthesia Type:General  Level of Consciousness: awake, alert  and oriented  Airway and Oxygen Therapy: Patient Spontanous Breathing  Post-op Pain: mild  Post-op Assessment: Post-op Vital signs reviewed, Patient's Cardiovascular Status Stable, Respiratory Function Stable, Patent Airway, No signs of Nausea or vomiting and Adequate PO intake  Post-op Vital Signs: Reviewed and stable  Complications: Patient with history of root canal in right upper tooth. Upon awakening in the PACU patient noticed that tooth and broken in half and disentigrated. Remenants were lying on the blanket in front of his mouth. The base was not loose and the patient denied pain. His intubation and case were uneventful. Prior to extubation, a yankour was used for suctioning and an oral airway was placed. The paitient did bite down on the tube prior to extubation however damage to the tooth was not seen at this time. I discuessed this with the patient and questioned a DDS operating in an adjacent OR whom stated there was nothing further to do today. The patient will arrange follow up with a DDS for repair.

## 2013-04-15 NOTE — Preoperative (Signed)
Beta Blockers   Reason not to administer Beta Blockers:Not Applicable 

## 2013-04-17 ENCOUNTER — Encounter (HOSPITAL_COMMUNITY): Payer: Self-pay | Admitting: General Surgery

## 2013-04-18 ENCOUNTER — Telehealth (INDEPENDENT_AMBULATORY_CARE_PROVIDER_SITE_OTHER): Payer: Self-pay

## 2013-04-18 NOTE — Telephone Encounter (Signed)
Message copied by Brennan BaileyBROOKS, Hadriel Northup on Thu Apr 18, 2013  1:55 PM ------      Message from: [facility], SUBERINA      Created: Thu Apr 18, 2013  1:33 PM      Contact: [phone]       HE HAD SURGERY ON Tuesday AND NEEDS A PO APPT ------

## 2013-04-18 NOTE — Telephone Encounter (Signed)
Called pt with po appt.  

## 2013-04-30 ENCOUNTER — Encounter (INDEPENDENT_AMBULATORY_CARE_PROVIDER_SITE_OTHER): Payer: Self-pay | Admitting: General Surgery

## 2013-04-30 ENCOUNTER — Ambulatory Visit (INDEPENDENT_AMBULATORY_CARE_PROVIDER_SITE_OTHER): Payer: BC Managed Care – PPO | Admitting: General Surgery

## 2013-04-30 VITALS — BP 126/72 | HR 80 | Temp 97.8°F | Resp 14 | Ht 72.0 in | Wt 206.0 lb

## 2013-04-30 DIAGNOSIS — Z09 Encounter for follow-up examination after completed treatment for conditions other than malignant neoplasm: Secondary | ICD-10-CM | POA: Insufficient documentation

## 2013-04-30 NOTE — Progress Notes (Signed)
Subjective:     Patient ID: Grant [name], male   DOB: 1980/05/04, 33 y.o.   MRN: [id number]  HPI The patient is going okay after surgery however he has not had the results that he would've wanted. He continues to have some the preoperative abdominal discomfort.  Review of Systems No fevers or chills but continued discomfort in the areas that he had preoperatively.    Objective:   Physical Exam His wounds have healed well with no evidence of infection. He has no palpable masses. He's got excellent bowel sounds.    Assessment:     Continue postoperative discomfort after cholecystectomy for biliary dyskinesia.     Plan:     The patient is moving out of the area and I've offered him followup with any physician that he obtained in the [place] area in the future. I also told him to wait for approximately 3 months after surgery to make sure that the surgery has not been affected. No other plans for evaluation at this time.

## 2013-05-01 ENCOUNTER — Encounter (INDEPENDENT_AMBULATORY_CARE_PROVIDER_SITE_OTHER): Payer: Self-pay

## 2013-05-01 ENCOUNTER — Telehealth (INDEPENDENT_AMBULATORY_CARE_PROVIDER_SITE_OTHER): Payer: Self-pay

## 2013-05-01 NOTE — Telephone Encounter (Signed)
Note faxed.

## 2013-05-01 NOTE — Telephone Encounter (Signed)
Message copied by Brennan BaileyBROOKS, Loraina Stauffer on Wed May 01, 2013  3:08 PM ------      Message from: Mervin KungPEGRAM, SONYA      Created: Wed May 01, 2013  2:45 PM      Contact: [phone]       Pt called he states that he needs a release to work note for 05/06/2013. Please fax note to [phone] att: debra simpson      sonya ------

## 2013-05-09 ENCOUNTER — Encounter (INDEPENDENT_AMBULATORY_CARE_PROVIDER_SITE_OTHER): Payer: Self-pay

## 2013-05-09 ENCOUNTER — Other Ambulatory Visit: Payer: Self-pay | Admitting: Gastroenterology

## 2013-05-09 DIAGNOSIS — R1013 Epigastric pain: Secondary | ICD-10-CM

## 2013-05-20 ENCOUNTER — Ambulatory Visit (HOSPITAL_COMMUNITY): Payer: BC Managed Care – PPO

## 2013-05-24 ENCOUNTER — Encounter (INDEPENDENT_AMBULATORY_CARE_PROVIDER_SITE_OTHER): Payer: Self-pay

## 2015-04-16 IMAGING — NM NM HEPATO W/GB/PHARM/[PERSON_NAME]
2 series · 12 of 12 positions shown · non-contrast
Comparison: None.

CLINICAL DATA: Epigastric region pain

EXAM:
NUCLEAR MEDICINE HEPATOBILIARY IMAGING WITH GALLBLADDER EF
Views:  Anterior, right lateral right upper quadrant
Radionuclide: Technetium 99m Choletec
Dose:  5.0 mCi
Route of administration: Intravenous

[he hepatobiliary · 3.43mm/px · 6 of 30 frames shown (1 of 2)]
[frame 3/30]
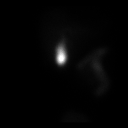
[frame 8/30]
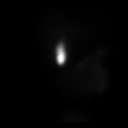
[frame 13/30]
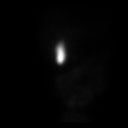
[frame 18/30]
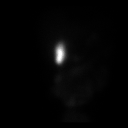
[frame 23/30]
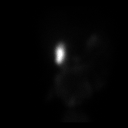
[frame 28/30]
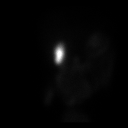

[he hepatobiliary · 3.43mm/px · 6 of 60 frames shown (2 of 2)]
[frame 6/60]
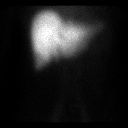
[frame 16/60]
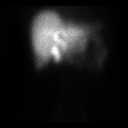
[frame 26/60]
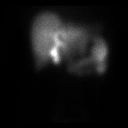
[frame 36/60]
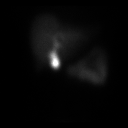
[frame 46/60]
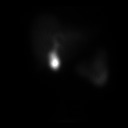
[frame 56/60]
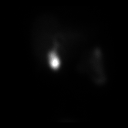

[12 of 12 positions shown; findings below may reference images not displayed]

FINDINGS: Liver uptake of radiotracer is normal. There is prompt visualization
of gallbladder and small bowel, indicating patency of the cystic and
common bile ducts.

A weight based dose, 1.95 mcg, of CCK was administered intravenously
with calculation of the computer generated ejection fraction of
radiotracer from the gallbladder. The patient did not experience
pain with CCK administration. The ejection fraction of radiotracer
from the gallbladder is abnormally low at 12.0%, normal greater than
38%.
IMPRESSION: Abnormally low ejection fraction of radiotracer gallbladder. This
finding is concerning for biliary dyskinesia. Cystic and common bile
ducts are patent as is evidenced by visualization of gallbladder and
small bowel.
